# Patient Record
Sex: Female | Born: 1975 | Hispanic: Yes | Marital: Single | Smoking: Never smoker
Health system: Southern US, Community
[De-identification: ages and names within clinical notes are randomized; demographics above are authoritative.]

---

## 2004-08-04 ENCOUNTER — Emergency Department: Payer: Self-pay | Admitting: Emergency Medicine

## 2005-05-06 ENCOUNTER — Emergency Department: Payer: Self-pay | Admitting: Emergency Medicine

## 2005-05-07 ENCOUNTER — Ambulatory Visit: Payer: Self-pay | Admitting: Emergency Medicine

## 2005-05-08 ENCOUNTER — Ambulatory Visit: Payer: Self-pay | Admitting: Unknown Physician Specialty

## 2005-05-11 ENCOUNTER — Ambulatory Visit: Payer: Self-pay | Admitting: Unknown Physician Specialty

## 2009-09-23 ENCOUNTER — Encounter: Payer: Self-pay | Admitting: Maternal and Fetal Medicine

## 2009-09-26 ENCOUNTER — Encounter: Payer: Self-pay | Admitting: Maternal & Fetal Medicine

## 2009-10-05 ENCOUNTER — Encounter: Payer: Self-pay | Admitting: Maternal & Fetal Medicine

## 2009-11-02 ENCOUNTER — Inpatient Hospital Stay: Payer: Self-pay | Admitting: Obstetrics and Gynecology

## 2012-03-22 ENCOUNTER — Emergency Department: Payer: Self-pay | Admitting: Emergency Medicine

## 2012-03-22 LAB — COMPREHENSIVE METABOLIC PANEL
Alkaline Phosphatase: 88 U/L (ref 50–136)
Anion Gap: 7 (ref 7–16)
BUN: 10 mg/dL (ref 7–18)
Calcium, Total: 8.7 mg/dL (ref 8.5–10.1)
Chloride: 108 mmol/L — ABNORMAL HIGH (ref 98–107)
Creatinine: 0.64 mg/dL (ref 0.60–1.30)
EGFR (African American): >60
EGFR (Non-African Amer.): >60
Osmolality: 273 (ref 275–301)
SGOT(AST): 23 U/L (ref 15–37)
SGPT (ALT): 29 U/L
Total Protein: 8 g/dL (ref 6.4–8.2)

## 2012-03-22 LAB — URINALYSIS, COMPLETE
Bilirubin,UR: NEGATIVE
Blood: NEGATIVE
Glucose,UR: NEGATIVE mg/dL (ref 0–75)
Leukocyte Esterase: NEGATIVE
Ph: 6 (ref 4.5–8.0)
RBC,UR: NONE SEEN /HPF (ref 0–5)
Squamous Epithelial: <1
WBC UR: <1 /HPF (ref 0–5)

## 2012-03-22 LAB — CBC
HCT: 39.1 % (ref 35.0–47.0)
MCHC: 33.4 g/dL (ref 32.0–36.0)
Platelet: 265 10*3/uL (ref 150–440)
RDW: 13 % (ref 11.5–14.5)
WBC: 10.6 10*3/uL (ref 3.6–11.0)

## 2012-03-22 LAB — LIPASE, BLOOD: Lipase: 107 U/L (ref 73–393)

## 2015-12-05 ENCOUNTER — Other Ambulatory Visit: Payer: Self-pay | Admitting: Obstetrics and Gynecology

## 2015-12-05 DIAGNOSIS — N6325 Unspecified lump in the left breast, overlapping quadrants: Secondary | ICD-10-CM

## 2015-12-05 DIAGNOSIS — N6320 Unspecified lump in the left breast, unspecified quadrant: Secondary | ICD-10-CM

## 2015-12-05 DIAGNOSIS — N6321 Unspecified lump in the left breast, upper outer quadrant: Secondary | ICD-10-CM

## 2015-12-16 ENCOUNTER — Ambulatory Visit
Admission: RE | Admit: 2015-12-16 | Discharge: 2015-12-16 | Disposition: A | Discharge status: Home / Self Care | Payer: BLUE CROSS/BLUE SHIELD | Source: Ambulatory Visit | Attending: Obstetrics and Gynecology | Admitting: Obstetrics and Gynecology

## 2015-12-16 DIAGNOSIS — N63 Unspecified lump in breast: Secondary | ICD-10-CM | POA: Diagnosis present

## 2015-12-16 DIAGNOSIS — N6321 Unspecified lump in the left breast, upper outer quadrant: Secondary | ICD-10-CM

## 2015-12-16 DIAGNOSIS — N6325 Unspecified lump in the left breast, overlapping quadrants: Secondary | ICD-10-CM

## 2015-12-16 DIAGNOSIS — N6320 Unspecified lump in the left breast, unspecified quadrant: Secondary | ICD-10-CM

## 2016-12-10 ENCOUNTER — Other Ambulatory Visit: Payer: Self-pay | Admitting: Obstetrics and Gynecology

## 2016-12-10 DIAGNOSIS — Z1231 Encounter for screening mammogram for malignant neoplasm of breast: Secondary | ICD-10-CM

## 2017-01-08 ENCOUNTER — Ambulatory Visit
Admission: RE | Admit: 2017-01-08 | Discharge: 2017-01-08 | Disposition: A | Discharge status: Home / Self Care | Payer: Managed Care, Other (non HMO) | Source: Ambulatory Visit | Attending: Obstetrics and Gynecology | Admitting: Obstetrics and Gynecology

## 2017-01-08 DIAGNOSIS — Z1231 Encounter for screening mammogram for malignant neoplasm of breast: Secondary | ICD-10-CM | POA: Diagnosis not present

## 2018-02-09 ENCOUNTER — Other Ambulatory Visit: Payer: Self-pay | Admitting: Obstetrics and Gynecology

## 2018-02-09 DIAGNOSIS — Z1231 Encounter for screening mammogram for malignant neoplasm of breast: Secondary | ICD-10-CM

## 2018-02-25 ENCOUNTER — Ambulatory Visit
Admission: RE | Admit: 2018-02-25 | Discharge: 2018-02-25 | Disposition: A | Discharge status: Home / Self Care | Payer: Managed Care, Other (non HMO) | Source: Ambulatory Visit | Attending: Obstetrics and Gynecology | Admitting: Obstetrics and Gynecology

## 2018-02-25 DIAGNOSIS — Z1231 Encounter for screening mammogram for malignant neoplasm of breast: Secondary | ICD-10-CM

## 2022-01-28 ENCOUNTER — Other Ambulatory Visit: Payer: Self-pay | Admitting: Internal Medicine

## 2022-01-28 DIAGNOSIS — Z1231 Encounter for screening mammogram for malignant neoplasm of breast: Secondary | ICD-10-CM

## 2022-03-03 ENCOUNTER — Ambulatory Visit
Admission: RE | Admit: 2022-03-03 | Discharge: 2022-03-03 | Disposition: A | Discharge status: Home / Self Care | Payer: 59 | Source: Ambulatory Visit | Attending: Internal Medicine | Admitting: Internal Medicine

## 2022-03-03 DIAGNOSIS — Z1231 Encounter for screening mammogram for malignant neoplasm of breast: Secondary | ICD-10-CM | POA: Insufficient documentation

## 2022-03-10 ENCOUNTER — Other Ambulatory Visit: Payer: Self-pay | Admitting: Family Medicine

## 2022-03-10 DIAGNOSIS — R928 Other abnormal and inconclusive findings on diagnostic imaging of breast: Secondary | ICD-10-CM

## 2022-03-10 DIAGNOSIS — N6489 Other specified disorders of breast: Secondary | ICD-10-CM

## 2022-03-27 ENCOUNTER — Ambulatory Visit
Admission: RE | Admit: 2022-03-27 | Discharge: 2022-03-27 | Disposition: A | Discharge status: Home / Self Care | Payer: 59 | Source: Ambulatory Visit | Attending: Family Medicine | Admitting: Family Medicine

## 2022-03-27 DIAGNOSIS — R928 Other abnormal and inconclusive findings on diagnostic imaging of breast: Secondary | ICD-10-CM

## 2022-03-27 DIAGNOSIS — N6489 Other specified disorders of breast: Secondary | ICD-10-CM | POA: Diagnosis present

## 2022-12-17 ENCOUNTER — Encounter: Payer: Self-pay | Admitting: *Deleted

## 2022-12-18 ENCOUNTER — Encounter: Payer: Self-pay | Admitting: *Deleted

## 2022-12-18 ENCOUNTER — Ambulatory Visit: Payer: 59 | Admitting: Anesthesiology

## 2022-12-18 ENCOUNTER — Encounter
Admission: RE | Disposition: A | Discharge status: Home / Self Care | Payer: Self-pay | Source: Home / Self Care | Attending: Gastroenterology

## 2022-12-18 ENCOUNTER — Ambulatory Visit
Admission: RE | Admit: 2022-12-18 | Discharge: 2022-12-18 | Disposition: A | Discharge status: Home / Self Care | Payer: 59 | Attending: Gastroenterology | Admitting: Gastroenterology

## 2022-12-18 DIAGNOSIS — Z1211 Encounter for screening for malignant neoplasm of colon: Secondary | ICD-10-CM | POA: Insufficient documentation

## 2022-12-18 HISTORY — PX: COLONOSCOPY WITH PROPOFOL: SHX5780

## 2022-12-18 LAB — POCT PREGNANCY, URINE: Preg Test, Ur: NEGATIVE

## 2022-12-18 SURGERY — COLONOSCOPY WITH PROPOFOL
Anesthesia: General

## 2022-12-18 MED ORDER — PROPOFOL 500 MG/50ML IV EMUL
INTRAVENOUS | Status: DC | PRN
  Administered 2022-12-18: 100 ug/kg/min via INTRAVENOUS

## 2022-12-18 MED ORDER — PROPOFOL 10 MG/ML IV BOLUS
INTRAVENOUS | Status: DC | PRN
  Administered 2022-12-18: 100 mg via INTRAVENOUS

## 2022-12-18 MED ORDER — LIDOCAINE HCL (CARDIAC) PF 100 MG/5ML IV SOSY
PREFILLED_SYRINGE | INTRAVENOUS | Status: DC | PRN
  Administered 2022-12-18: 50 mg via INTRAVENOUS

## 2022-12-18 MED ORDER — SODIUM CHLORIDE 0.9 % IV SOLN: INTRAVENOUS | Status: DC

## 2022-12-18 MED ORDER — PROPOFOL 1000 MG/100ML IV EMUL
INTRAVENOUS | Status: AC
  Filled 2022-12-18: qty 100

## 2022-12-18 NOTE — Transfer of Care (Signed)
Immediate Anesthesia Transfer of Care Note  Patient: Becky Christensen  Procedure(s) Performed: COLONOSCOPY WITH PROPOFOL  Patient Location: PACU  Anesthesia Type:MAC  Level of Consciousness: awake, alert , and oriented  Airway & Oxygen Therapy: Patient Spontanous Breathing and Patient connected to nasal cannula oxygen  Post-op Assessment: Report given to RN and Post -op Vital signs reviewed and stable  Post vital signs: stable  Last Vitals:  Vitals Value Taken Time  BP 91/65 12/18/22 1343  Temp 36.1 C 12/18/22 1341  Pulse 99 12/18/22 1341  Resp 23 12/18/22 1345  SpO2 100 % 12/18/22 1341  Vitals shown include unvalidated device data.  Last Pain:  Vitals:   12/18/22 1341  TempSrc: Temporal  PainSc: Asleep         Complications: No notable events documented.

## 2022-12-18 NOTE — Anesthesia Postprocedure Evaluation (Signed)
Anesthesia Post Note  Patient: Becky Christensen  Procedure(s) Performed: COLONOSCOPY WITH PROPOFOL  Patient location during evaluation: Endoscopy Anesthesia Type: General Level of consciousness: awake and alert Pain management: pain level controlled Vital Signs Assessment: post-procedure vital signs reviewed and stable Respiratory status: spontaneous breathing, nonlabored ventilation and respiratory function stable Cardiovascular status: blood pressure returned to baseline and stable Postop Assessment: no apparent nausea or vomiting Anesthetic complications: no  No notable events documented.   Last Vitals:  Vitals:   12/18/22 1351 12/18/22 1401  BP: 107/63   Pulse: 91   Resp:    Temp:    SpO2: 100% 100%    Last Pain:  Vitals:   12/18/22 1401  TempSrc:   PainSc: 0-No pain                 Alphonsus Sias

## 2022-12-18 NOTE — Interval H&P Note (Signed)
History and Physical Interval Note:  12/18/2022 1:21 PM  Becky Christensen  has presented today for surgery, with the diagnosis of colon cancer screening.  The various methods of treatment have been discussed with the patient and family. After consideration of risks, benefits and other options for treatment, the patient has consented to  Procedure(s) with comments: COLONOSCOPY WITH PROPOFOL (N/A) - SPANISH INTERPRETER as a surgical intervention.  The patient's history has been reviewed, patient examined, no change in status, stable for surgery.  I have reviewed the patient's chart and labs.  Questions were answered to the patient's satisfaction.     [name]  Ok to proceed with colonoscopy

## 2022-12-18 NOTE — Anesthesia Preprocedure Evaluation (Signed)
Anesthesia Evaluation  Patient identified by MRN, date of birth, ID band Patient awake    Reviewed: Allergy & Precautions, NPO status , Patient's Chart, lab work & pertinent test results  Airway Mallampati: II  TM Distance: >3 FB Neck ROM: full    Dental  (+) Chipped, Dental Advidsory Given   Pulmonary neg pulmonary ROS   Pulmonary exam normal        Cardiovascular negative cardio ROS Normal cardiovascular exam     Neuro/Psych negative neurological ROS  negative psych ROS   GI/Hepatic negative GI ROS, Neg liver ROS,,,  Endo/Other  negative endocrine ROS    Renal/GU negative Renal ROS  negative genitourinary   Musculoskeletal   Abdominal   Peds  Hematology negative hematology ROS (+)   Anesthesia Other Findings History reviewed. No pertinent past medical history.  History reviewed. No pertinent surgical history.     Reproductive/Obstetrics negative OB ROS                             Anesthesia Physical Anesthesia Plan  ASA: 1  Anesthesia Plan: General   Post-op Pain Management: Minimal or no pain anticipated   Induction: Intravenous  PONV Risk Score and Plan: 3 and Propofol infusion, TIVA and Ondansetron  Airway Management Planned: Nasal Cannula  Additional Equipment: None  Intra-op Plan:   Post-operative Plan:   Informed Consent: I have reviewed the patients History and Physical, chart, labs and discussed the procedure including the risks, benefits and alternatives for the proposed anesthesia with the patient or authorized representative who has indicated his/her understanding and acceptance.     Dental advisory given  Plan Discussed with: CRNA and Surgeon  Anesthesia Plan Comments: (Discussed risks of anesthesia with patient, including possibility of difficulty with spontaneous ventilation under anesthesia necessitating airway intervention, PONV, and rare risks such  as cardiac or respiratory or neurological events, and allergic reactions. Discussed the role of CRNA in patient's perioperative care. Patient understands.)       Anesthesia Quick Evaluation

## 2022-12-18 NOTE — Op Note (Signed)
[facility] Patient Name: Becky Christensen Procedure Date: 12/18/2022 1:15 PM MRN: [id number] Account #: [id number] Date of Birth: November 24, 1975 Admit Type: Outpatient Age: 47 Room: [facility] ENDO ROOM 3 Gender: Female Note Status: Finalized Instrument Name: Peds Colonoscope [id number] Procedure:             Colonoscopy Indications:           Screening for colorectal malignant neoplasm Providers:             Andrey Farmer MD, MD Referring MD:          Angelina Pih (Referring MD) Medicines:             Monitored Anesthesia Care Complications:         No immediate complications. Procedure:             Pre-Anesthesia Assessment:                        - Prior to the procedure, a History and Physical was                         performed, and patient medications and allergies were                         reviewed. The patient is competent. The risks and                         benefits of the procedure and the sedation options and                         risks were discussed with the patient. All questions                         were answered and informed consent was obtained.                         Patient identification and proposed procedure were                         verified by the physician, the nurse, the                         anesthesiologist, the anesthetist and the technician                         in the endoscopy suite. Mental Status Examination:                         alert and oriented. Airway Examination: normal                         oropharyngeal airway and neck mobility. Respiratory                         Examination: clear to auscultation. CV Examination:                         normal. Prophylactic Antibiotics: The patient does not  require prophylactic antibiotics. Prior                         Anticoagulants: The patient has taken no anticoagulant                         or antiplatelet  agents. ASA Grade Assessment: I - A                         normal, healthy patient. After reviewing the risks and                         benefits, the patient was deemed in satisfactory                         condition to undergo the procedure. The anesthesia                         plan was to use monitored anesthesia care (MAC).                         Immediately prior to administration of medications,                         the patient was re-assessed for adequacy to receive                         sedatives. The heart rate, respiratory rate, oxygen                         saturations, blood pressure, adequacy of pulmonary                         ventilation, and response to care were monitored                         throughout the procedure. The physical status of the                         patient was re-assessed after the procedure.                        After obtaining informed consent, the colonoscope was                         passed under direct vision. Throughout the procedure,                         the patient's blood pressure, pulse, and oxygen                         saturations were monitored continuously. The                         Colonoscope was introduced through the anus and                         advanced to the the terminal ileum. The colonoscopy  was performed without difficulty. The patient                         tolerated the procedure well. The quality of the bowel                         preparation was good. The terminal ileum, ileocecal                         valve, appendiceal orifice, and rectum were                         photographed. Findings:      The perianal and digital rectal examinations were normal.      The terminal ileum appeared normal.      The entire examined colon appeared normal on direct and retroflexion       views. Impression:            - The examined portion of the ileum was normal.                         - The entire examined colon is normal on direct and                         retroflexion views.                        - No specimens collected. Recommendation:        - Discharge patient to home.                        - Resume previous diet.                        - Repeat colonoscopy in 10 years for screening                         purposes.                        - Return to referring physician as previously                         scheduled. Procedure Code(s):     --- Professional ---                        [id number], Colorectal cancer screening; colonoscopy on                         individual not meeting criteria for high risk Diagnosis Code(s):     --- Professional ---                        Z12.11, Encounter for screening for malignant neoplasm                         of colon CPT copyright 2022 [employer]. All rights reserved. The codes documented in this report are preliminary and upon coder review may  be revised to meet current compliance requirements. Andrey Farmer MD, MD 12/18/2022 1:41:20  PM Number of Addenda: 0 Note Initiated On: 12/18/2022 1:15 PM Scope Withdrawal Time: 0 hours 6 minutes 29 seconds  Total Procedure Duration: 0 hours 10 minutes 29 seconds  Estimated Blood Loss:  Estimated blood loss: none.      [facility]

## 2022-12-18 NOTE — H&P (Signed)
Outpatient short stay form Pre-procedure 12/18/2022  [name], MD  Primary Physician: Benjaman Kindler, MD  Reason for visit:  Screening colonoscopy  History of present illness:    47 y/o lady here for index screening colonoscopy. No blood thinners. No family history of GI malignancies. No significant abdominal surgeries.    Current Facility-Administered Medications:    0.9 %  sodium chloride infusion, , Intravenous, Continuous, Marja Adderley, Hilton Cork, MD, Last Rate: 20 mL/hr at 12/18/22 1320, Continued from Pre-op at 12/18/22 1320  No medications prior to admission.     Not on File   History reviewed. No pertinent past medical history.  Review of systems:  Otherwise negative.    Physical Exam  Gen: Alert, oriented. Appears stated age.  HEENT: PERRLA. Lungs: No respiratory distress CV: RRR Abd: soft, benign, no masses Ext: No edema    Planned procedures: Proceed with colonoscopy. The patient understands the nature of the planned procedure, indications, risks, alternatives and potential complications including but not limited to bleeding, infection, perforation, damage to internal organs and possible oversedation/side effects from anesthesia. The patient agrees and gives consent to proceed.  Please refer to procedure notes for findings, recommendations and patient disposition/instructions.     [name], MD [facility]

## 2022-12-21 ENCOUNTER — Encounter: Payer: Self-pay | Admitting: Gastroenterology

## 2023-02-23 IMAGING — MG MM DIGITAL DIAGNOSTIC UNILAT*R* W/ TOMO W/ CAD
4 series · 4 of 12 positions shown · non-contrast
Comparison: Previous exam(s).

CLINICAL DATA: 46-year-old female recalled from screening mammogram
dated 03/03/2022 for a possible right breast asymmetry.

EXAM:
DIGITAL DIAGNOSTIC UNILATERAL RIGHT MAMMOGRAM WITH TOMOSYNTHESIS AND
CAD
TECHNIQUE: Right digital diagnostic mammography and breast tomosynthesis was
performed. The images were evaluated with computer-aided detection.

[R CC synth-2D]
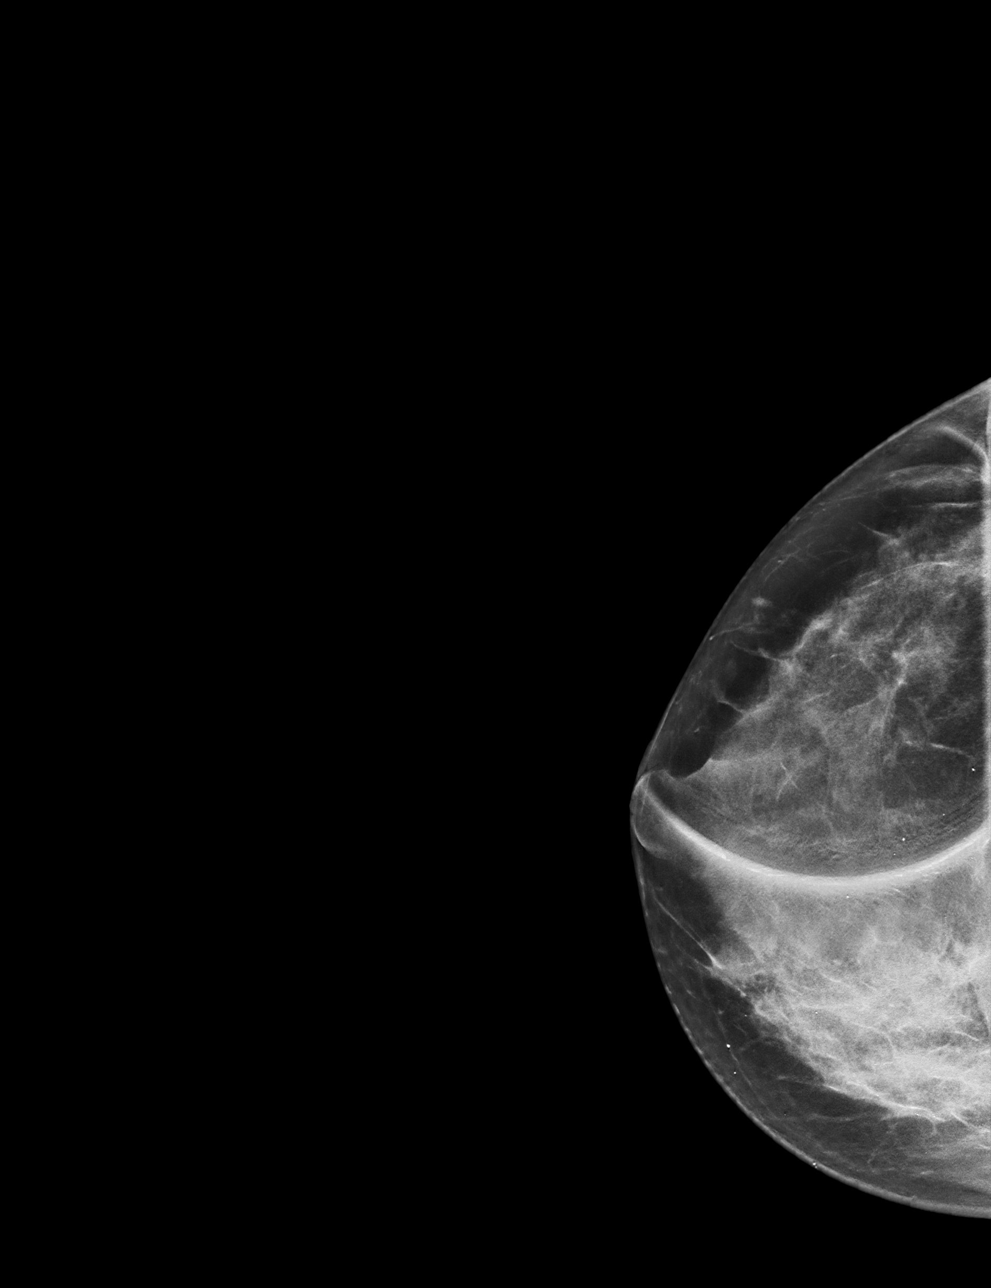

[R ML synth-2D]
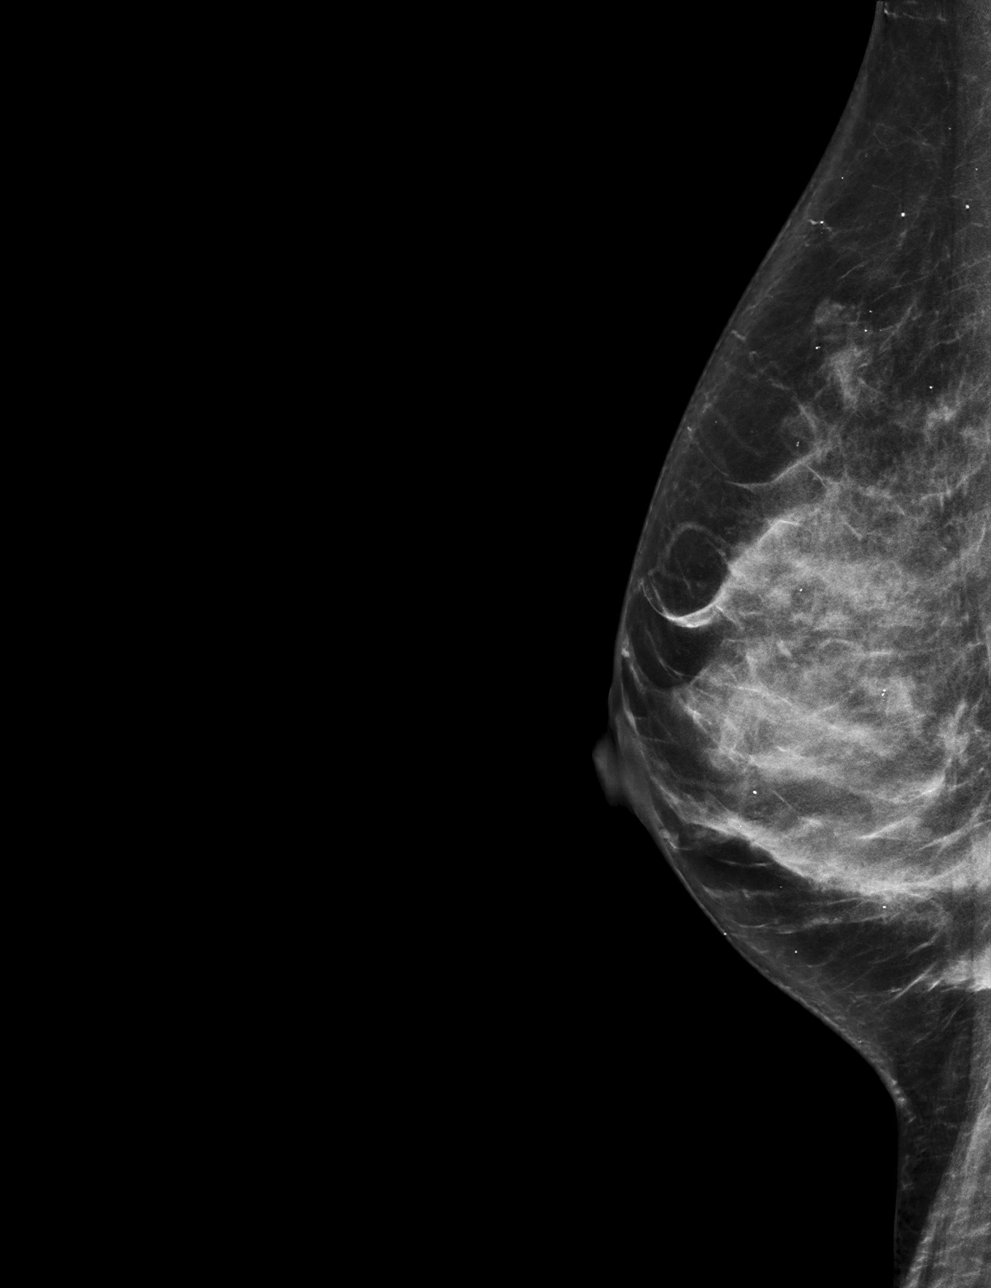

[R CC tomo · tomo slice 27/53.0]
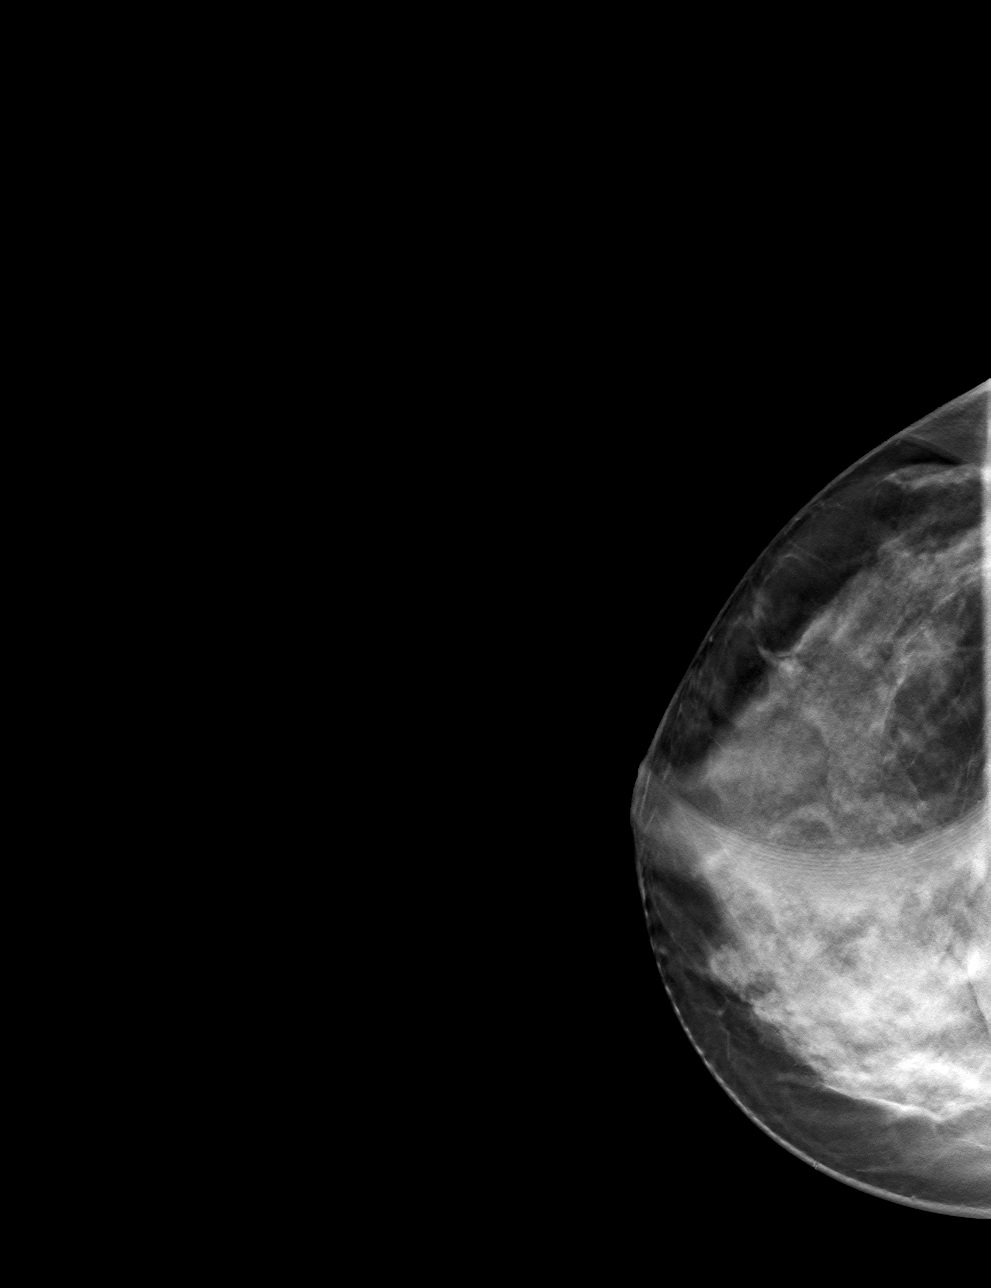

[R ML tomo · tomo slice 34/67.0]
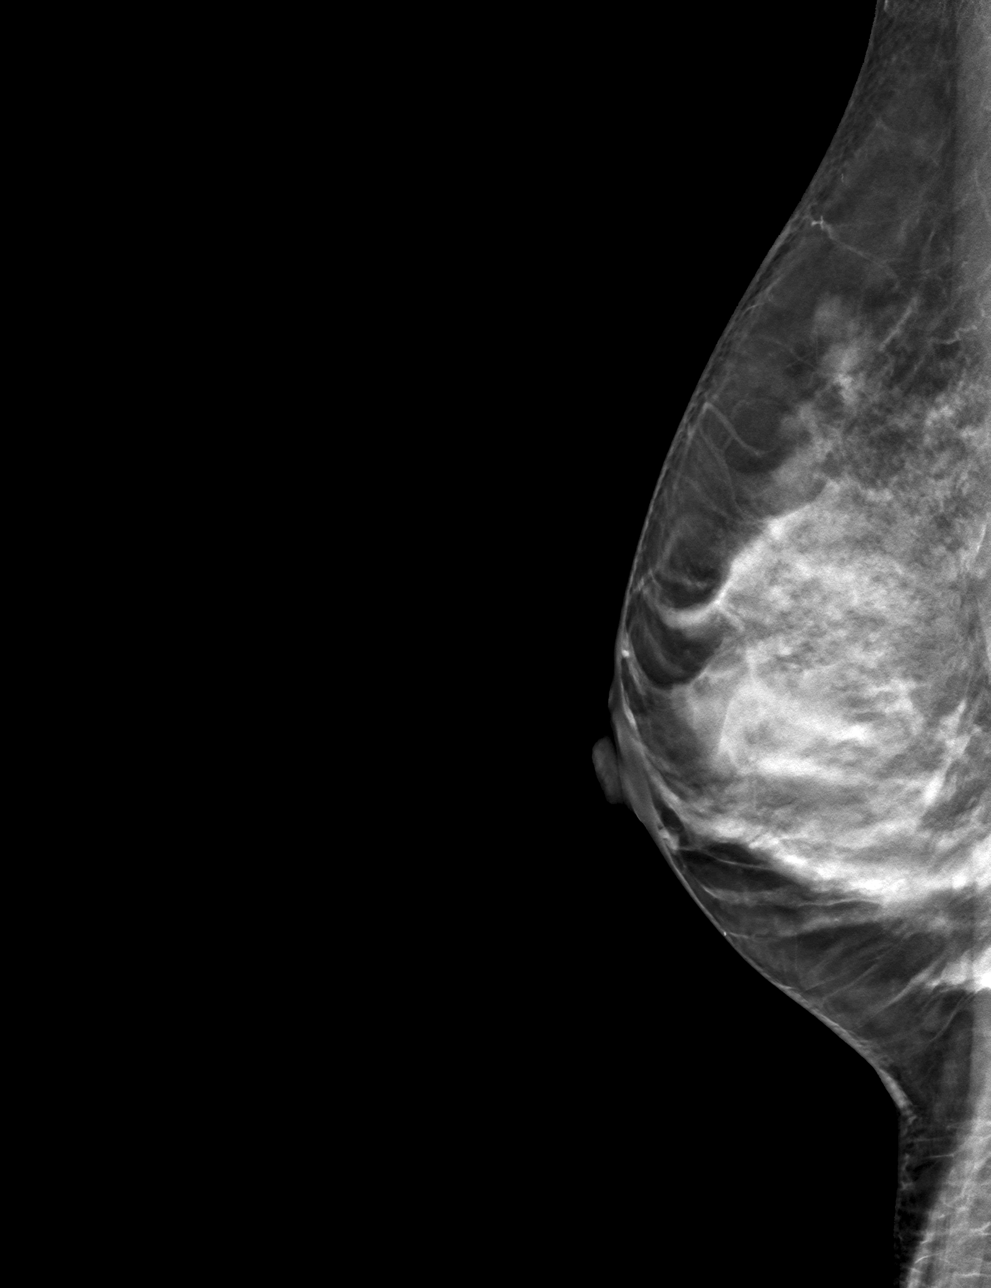

[4 of 12 positions shown; findings below may reference images not displayed]

ACR Breast Density Category c: The breast tissue is heterogeneously
dense, which may obscure small masses.
FINDINGS: Previously described, possible asymmetry in the lateral right breast
at posterior depth resolves into well dispersed fibroglandular
tissue on today's additional views. No suspicious findings
identified.
IMPRESSION: No mammographic evidence of malignancy.

RECOMMENDATION:
Screening mammogram in one year.(Code:AY-[id number])

I have discussed the findings and recommendations with the patient.
If applicable, a reminder letter will be sent to the patient
regarding the next appointment.

BI-RADS CATEGORY  1: Negative.

## 2023-04-06 ENCOUNTER — Other Ambulatory Visit: Payer: Self-pay | Admitting: Obstetrics and Gynecology

## 2023-04-06 DIAGNOSIS — Z1231 Encounter for screening mammogram for malignant neoplasm of breast: Secondary | ICD-10-CM

## 2024-02-03 ENCOUNTER — Other Ambulatory Visit: Payer: Self-pay | Admitting: Internal Medicine

## 2024-02-03 DIAGNOSIS — Z1231 Encounter for screening mammogram for malignant neoplasm of breast: Secondary | ICD-10-CM
